# Patient Record
Sex: Female | Born: 1960
Health system: Southern US, Community
[De-identification: ages and names within clinical notes are randomized; demographics above are authoritative.]

## PROBLEM LIST (undated history)

## (undated) DIAGNOSIS — E039 Hypothyroidism, unspecified: Secondary | ICD-10-CM

## (undated) DIAGNOSIS — E785 Hyperlipidemia, unspecified: Secondary | ICD-10-CM

## (undated) HISTORY — DX: Hypothyroidism, unspecified: E03.9

## (undated) HISTORY — DX: Hyperlipidemia, unspecified: E78.5

---

## 2009-06-29 ENCOUNTER — Encounter: Admission: RE | Admit: 2009-06-29 | Discharge: 2009-06-29 | Payer: Self-pay | Admitting: Specialist

## 2012-04-06 ENCOUNTER — Other Ambulatory Visit: Payer: Self-pay | Admitting: Obstetrics and Gynecology

## 2012-04-06 ENCOUNTER — Other Ambulatory Visit (HOSPITAL_COMMUNITY)
Admission: RE | Admit: 2012-04-06 | Discharge: 2012-04-06 | Disposition: A | Discharge status: Home / Self Care | Payer: BC Managed Care – PPO | Source: Ambulatory Visit | Attending: Obstetrics and Gynecology | Admitting: Obstetrics and Gynecology

## 2012-04-06 DIAGNOSIS — Z01419 Encounter for gynecological examination (general) (routine) without abnormal findings: Secondary | ICD-10-CM | POA: Insufficient documentation

## 2012-04-06 DIAGNOSIS — Z1151 Encounter for screening for human papillomavirus (HPV): Secondary | ICD-10-CM | POA: Insufficient documentation

## 2014-04-27 ENCOUNTER — Other Ambulatory Visit: Payer: Self-pay

## 2017-02-27 ENCOUNTER — Other Ambulatory Visit: Payer: Self-pay | Admitting: Obstetrics and Gynecology

## 2017-02-27 ENCOUNTER — Other Ambulatory Visit (HOSPITAL_COMMUNITY)
Admission: RE | Admit: 2017-02-27 | Discharge: 2017-02-27 | Disposition: A | Discharge status: Home / Self Care | Payer: 59 | Source: Ambulatory Visit | Attending: Obstetrics and Gynecology | Admitting: Obstetrics and Gynecology

## 2017-02-27 DIAGNOSIS — Z124 Encounter for screening for malignant neoplasm of cervix: Secondary | ICD-10-CM | POA: Diagnosis present

## 2017-03-04 LAB — CYTOLOGY - PAP
DIAGNOSIS: NEGATIVE
HPV (WINDOPATH): NOT DETECTED

## 2019-08-03 ENCOUNTER — Other Ambulatory Visit: Payer: Self-pay

## 2019-08-04 ENCOUNTER — Encounter: Payer: Self-pay | Admitting: Internal Medicine

## 2019-08-04 ENCOUNTER — Ambulatory Visit (INDEPENDENT_AMBULATORY_CARE_PROVIDER_SITE_OTHER): Payer: 59 | Admitting: Internal Medicine

## 2019-08-04 VITALS — BP 98/64 | HR 68 | Temp 97.3°F | Ht 62.0 in | Wt 121.8 lb

## 2019-08-04 DIAGNOSIS — E039 Hypothyroidism, unspecified: Secondary | ICD-10-CM | POA: Diagnosis not present

## 2019-08-04 DIAGNOSIS — E785 Hyperlipidemia, unspecified: Secondary | ICD-10-CM

## 2019-08-04 DIAGNOSIS — Z1231 Encounter for screening mammogram for malignant neoplasm of breast: Secondary | ICD-10-CM

## 2019-08-04 NOTE — Progress Notes (Signed)
New Patient Office Visit     This visit occurred during the SARS-CoV-2 public health emergency.  Safety protocols were in place, including screening questions prior to the visit, additional usage of staff PPE, and extensive cleaning of exam room while observing appropriate contact time as indicated for disinfecting solutions.    CC/Reason for Visit: Establish care, discuss chronic conditions Previous PCP: Dr. Ricci Barker Last Visit: 2018  HPI: Sarah Marshall is a 59 y.o. female who is coming in today for the above mentioned reasons. Past Medical History is significant for: Hyperlipidemia not on medications and hypothyroidism no longer on medications.  She has not seen her PCP since 2015.  She is married, she has 2 children ages 78 and 78 she works as an Equities trader.  She has no past surgical history.  She has no known drug allergies.  She is not a smoker, she drinks alcohol occasionally.  Her family history significant for a father who died at age 6 with coronary artery disease and a mother who has an undisclosed arrhythmia and is status post permanent pacemaker.  She is very physically active she walks for about an hour and a half 3 times a week, she also enjoys kayaking and hiking on the weekends.  She has no acute complaints today.  She has had both of her Covid vaccinations.   Past Medical/Surgical History: Past Medical History:  Diagnosis Date  . Hyperlipidemia   . Hypothyroidism     History reviewed. No pertinent surgical history.  Social History:  reports that she has never smoked. She has never used smokeless tobacco. She reports current alcohol use. She reports that she does not use drugs.  Allergies: No Known Allergies  Family History:  Family History  Problem Relation Age of Onset  . Arrhythmia Mother   . CAD Father      Current Outpatient Medications:  .  Cholecalciferol (VITAMIN D) 50 MCG (2000 UT) tablet, Take 2,000 Units by mouth daily., Disp: , Rfl:  .   Lysine HCl 1000 MG TABS, Take by mouth., Disp: , Rfl:  .  Magnesium Ascorbate POWD, by Does not apply route. Magic Mag, Disp: , Rfl:   Review of Systems:  Constitutional: Denies fever, chills, diaphoresis, appetite change and fatigue.  HEENT: Denies photophobia, eye pain, redness, hearing loss, ear pain, congestion, sore throat, rhinorrhea, sneezing, mouth sores, trouble swallowing, neck pain, neck stiffness and tinnitus.   Respiratory: Denies SOB, DOE, cough, chest tightness,  and wheezing.   Cardiovascular: Denies chest pain, palpitations and leg swelling.  Gastrointestinal: Denies nausea, vomiting, abdominal pain, diarrhea, constipation, blood in stool and abdominal distention.  Genitourinary: Denies dysuria, urgency, frequency, hematuria, flank pain and difficulty urinating.  Endocrine: Denies: hot or cold intolerance, sweats, changes in hair or nails, polyuria, polydipsia. Musculoskeletal: Denies myalgias, back pain, joint swelling, arthralgias and gait problem.  Skin: Denies pallor, rash and wound.  Neurological: Denies dizziness, seizures, syncope, weakness, light-headedness, numbness and headaches.  Hematological: Denies adenopathy. Easy bruising, personal or family bleeding history  Psychiatric/Behavioral: Denies suicidal ideation, mood changes, confusion, nervousness, sleep disturbance and agitation    Physical Exam: Vitals:   08/04/19 1338  BP: 98/64  Pulse: 68  Temp: (!) 97.3 F (36.3 C)  TempSrc: Temporal  SpO2: 96%  Weight: 121 lb 12.8 oz (55.2 kg)  Height: 5\' 2"  (1.575 m)   Body mass index is 22.28 kg/m.  Constitutional: NAD, calm, comfortable Eyes: PERRL, lids and conjunctivae normal ENMT: Mucous membranes are moist.  Respiratory:  clear to auscultation bilaterally, no wheezing, no crackles. Normal respiratory effort. No accessory muscle use.  Cardiovascular: Regular rate and rhythm, no murmurs / rubs / gallops. No extremity edema.  Neurologic: Grossly intact and  nonfocal Psychiatric: Normal judgment and insight. Alert and oriented x 3. Normal mood.    Impression and Plan:  Encounter for screening mammogram for malignant neoplasm of breast  - Plan: MM Digital Screening  Hypothyroidism, unspecified type -No longer medications, check thyroid function test when she returns for physical.  Hyperlipidemia -No LDL on file, check lipids when she returns for CPE.     Lelon Frohlich, MD [facility]

## 2019-09-01 ENCOUNTER — Ambulatory Visit
Admission: RE | Admit: 2019-09-01 | Discharge: 2019-09-01 | Disposition: A | Discharge status: Home / Self Care | Payer: 59 | Source: Ambulatory Visit | Attending: Internal Medicine | Admitting: Internal Medicine

## 2019-09-01 ENCOUNTER — Other Ambulatory Visit: Payer: Self-pay

## 2019-09-01 DIAGNOSIS — Z1231 Encounter for screening mammogram for malignant neoplasm of breast: Secondary | ICD-10-CM

## 2019-11-09 ENCOUNTER — Other Ambulatory Visit: Payer: Self-pay

## 2019-11-10 ENCOUNTER — Ambulatory Visit (INDEPENDENT_AMBULATORY_CARE_PROVIDER_SITE_OTHER): Payer: 59 | Admitting: Internal Medicine

## 2019-11-10 ENCOUNTER — Encounter: Payer: Self-pay | Admitting: Internal Medicine

## 2019-11-10 VITALS — BP 98/64 | HR 61 | Temp 98.1°F | Ht 63.0 in | Wt 120.3 lb

## 2019-11-10 DIAGNOSIS — Z0000 Encounter for general adult medical examination without abnormal findings: Secondary | ICD-10-CM

## 2019-11-10 DIAGNOSIS — E039 Hypothyroidism, unspecified: Secondary | ICD-10-CM | POA: Diagnosis not present

## 2019-11-10 NOTE — Addendum Note (Signed)
Addended by: Lerry Liner on: 11/10/2019 08:36 AM   Modules accepted: Orders

## 2019-11-10 NOTE — Patient Instructions (Signed)
-Nice seeing you today!!  -Lab work today; will notify you once results are available.  -Schedule follow up in 1 year or sooner as needed.   Preventive Care 40-59 Years Old, Female Preventive care refers to visits with your health care provider and lifestyle choices that can promote health and wellness. This includes:  A yearly physical exam. This may also be called an annual well check.  Regular dental visits and eye exams.  Immunizations.  Screening for certain conditions.  Healthy lifestyle choices, such as eating a healthy diet, getting regular exercise, not using drugs or products that contain nicotine and tobacco, and limiting alcohol use. What can I expect for my preventive care visit? Physical exam Your health care provider will check your:  Height and weight. This may be used to calculate body mass index (BMI), which tells if you are at a healthy weight.  Heart rate and blood pressure.  Skin for abnormal spots. Counseling Your health care provider may ask you questions about your:  Alcohol, tobacco, and drug use.  Emotional well-being.  Home and relationship well-being.  Sexual activity.  Eating habits.  Work and work environment.  Method of birth control.  Menstrual cycle.  Pregnancy history. What immunizations do I need?  Influenza (flu) vaccine  This is recommended every year. Tetanus, diphtheria, and pertussis (Tdap) vaccine  You may need a Td booster every 10 years. Varicella (chickenpox) vaccine  You may need this if you have not been vaccinated. Zoster (shingles) vaccine  You may need this after age 60. Measles, mumps, and rubella (MMR) vaccine  You may need at least one dose of MMR if you were born in 1957 or later. You may also need a second dose. Pneumococcal conjugate (PCV13) vaccine  You may need this if you have certain conditions and were not previously vaccinated. Pneumococcal polysaccharide (PPSV23) vaccine  You may need  one or two doses if you smoke cigarettes or if you have certain conditions. Meningococcal conjugate (MenACWY) vaccine  You may need this if you have certain conditions. Hepatitis A vaccine  You may need this if you have certain conditions or if you travel or work in places where you may be exposed to hepatitis A. Hepatitis B vaccine  You may need this if you have certain conditions or if you travel or work in places where you may be exposed to hepatitis B. Haemophilus influenzae type b (Hib) vaccine  You may need this if you have certain conditions. Human papillomavirus (HPV) vaccine  If recommended by your health care provider, you may need three doses over 6 months. You may receive vaccines as individual doses or as more than one vaccine together in one shot (combination vaccines). Talk with your health care provider about the risks and benefits of combination vaccines. What tests do I need? Blood tests  Lipid and cholesterol levels. These may be checked every 5 years, or more frequently if you are over 50 years old.  Hepatitis C test.  Hepatitis B test. Screening  Lung cancer screening. You may have this screening every year starting at age 55 if you have a 30-pack-year history of smoking and currently smoke or have quit within the past 15 years.  Colorectal cancer screening. All adults should have this screening starting at age 50 and continuing until age 75. Your health care provider may recommend screening at age 45 if you are at increased risk. You will have tests every 1-10 years, depending on your results and the type   of screening test.  Diabetes screening. This is done by checking your blood sugar (glucose) after you have not eaten for a while (fasting). You may have this done every 1-3 years.  Mammogram. This may be done every 1-2 years. Talk with your health care provider about when you should start having regular mammograms. This may depend on whether you have a family  history of breast cancer.  BRCA-related cancer screening. This may be done if you have a family history of breast, ovarian, tubal, or peritoneal cancers.  Pelvic exam and Pap test. This may be done every 3 years starting at age 9. Starting at age 72, this may be done every 5 years if you have a Pap test in combination with an HPV test. Other tests  Sexually transmitted disease (STD) testing.  Bone density scan. This is done to screen for osteoporosis. You may have this scan if you are at high risk for osteoporosis. Follow these instructions at home: Eating and drinking  Eat a diet that includes fresh fruits and vegetables, whole grains, lean protein, and low-fat dairy.  Take vitamin and mineral supplements as recommended by your health care provider.  Do not drink alcohol if: ? Your health care provider tells you not to drink. ? You are pregnant, may be pregnant, or are planning to become pregnant.  If you drink alcohol: ? Limit how much you have to 0-1 drink a day. ? Be aware of how much alcohol is in your drink. In the [place] one drink equals one 12 oz bottle of beer (355 mL), one 5 oz glass of wine (148 mL), or one 1 oz glass of hard liquor (44 mL). Lifestyle  Take daily care of your teeth and gums.  Stay active. Exercise for at least 30 minutes on 5 or more days each week.  Do not use any products that contain nicotine or tobacco, such as cigarettes, e-cigarettes, and chewing tobacco. If you need help quitting, ask your health care provider.  If you are sexually active, practice safe sex. Use a condom or other form of birth control (contraception) in order to prevent pregnancy and STIs (sexually transmitted infections).  If told by your health care provider, take low-dose aspirin daily starting at age 83. What's next?  Visit your health care provider once a year for a well check visit.  Ask your health care provider how often you should have your eyes and teeth  checked.  Stay up to date on all vaccines. This information is not intended to replace advice given to you by your health care provider. Make sure you discuss any questions you have with your health care provider. Document Revised: 10/09/2017 Document Reviewed: 10/09/2017 Elsevier Patient Education  2020 [employer].

## 2019-11-10 NOTE — Progress Notes (Signed)
Established Patient Office Visit     This visit occurred during the SARS-CoV-2 public health emergency.  Safety protocols were in place, including screening questions prior to the visit, additional usage of staff PPE, and extensive cleaning of exam room while observing appropriate contact time as indicated for disinfecting solutions.    CC/Reason for Visit: Annual preventive exam  HPI: Sarah Marshall is a 59 y.o. female who is coming in today for the above mentioned reasons. Past Medical History is significant for: Hypothyroidism not on medications.  She has no acute complaints today.  She had a normal mammogram in July, she had a Pap smear in 2019 and follows with GYN.  She had a colonoscopy in 2014.  She is due for Tdap and shingles vaccines.  She tells me she has a history of hyperlipidemia but is not currently on medications.  She exercises 5 days a week combination of walking, hiking and sometimes kayaking.   Past Medical/Surgical History: Past Medical History:  Diagnosis Date  . Hyperlipidemia   . Hypothyroidism     No past surgical history on file.  Social History:  reports that she has never smoked. She has never used smokeless tobacco. She reports current alcohol use. She reports that she does not use drugs.  Allergies: No Known Allergies  Family History:  Family History  Problem Relation Age of Onset  . Arrhythmia Mother   . CAD Father      Current Outpatient Medications:  .  Cholecalciferol (VITAMIN D) 50 MCG (2000 UT) tablet, Take 2,000 Units by mouth daily., Disp: , Rfl:  .  Lysine HCl 1000 MG TABS, Take by mouth., Disp: , Rfl:  .  Magnesium Ascorbate POWD, by Does not apply route. Magic Mag, Disp: , Rfl:   Review of Systems:  Constitutional: Denies fever, chills, diaphoresis, appetite change and fatigue.  HEENT: Denies photophobia, eye pain, redness, hearing loss, ear pain, congestion, sore throat, rhinorrhea, sneezing, mouth sores, trouble  swallowing, neck pain, neck stiffness and tinnitus.   Respiratory: Denies SOB, DOE, cough, chest tightness,  and wheezing.   Cardiovascular: Denies chest pain, palpitations and leg swelling.  Gastrointestinal: Denies nausea, vomiting, abdominal pain, diarrhea, constipation, blood in stool and abdominal distention.  Genitourinary: Denies dysuria, urgency, frequency, hematuria, flank pain and difficulty urinating.  Endocrine: Denies: hot or cold intolerance, sweats, changes in hair or nails, polyuria, polydipsia. Musculoskeletal: Denies myalgias, back pain, joint swelling, arthralgias and gait problem.  Skin: Denies pallor, rash and wound.  Neurological: Denies dizziness, seizures, syncope, weakness, light-headedness, numbness and headaches.  Hematological: Denies adenopathy. Easy bruising, personal or family bleeding history  Psychiatric/Behavioral: Denies suicidal ideation, mood changes, confusion, nervousness, sleep disturbance and agitation    Physical Exam: Vitals:   11/10/19 0744  BP: 98/64  Pulse: 61  Temp: 98.1 F (36.7 C)  TempSrc: Oral  SpO2: 96%  Weight: 120 lb 4.8 oz (54.6 kg)  Height: _0  (1.6 m)    Body mass index is 21.31 kg/m.   Constitutional: NAD, calm, comfortable Eyes: PERRL, lids and conjunctivae normal ENMT: Mucous membranes are moist. Posterior pharynx clear of any exudate or lesions. Normal dentition. Tympanic membrane is pearly white, no erythema or bulging. Neck: normal, supple, no masses, no thyromegaly Respiratory: clear to auscultation bilaterally, no wheezing, no crackles. Normal respiratory effort. No accessory muscle use.  Cardiovascular: Regular rate and rhythm, no murmurs / rubs / gallops. No extremity edema. 2+ pedal pulses. No carotid bruits.  Abdomen: no tenderness,  no masses palpated. No hepatosplenomegaly. Bowel sounds positive.  Musculoskeletal: no clubbing / cyanosis. No joint deformity upper and lower extremities. Good ROM, no  contractures. Normal muscle tone.  Skin: no rashes, lesions, ulcers. No induration Neurologic: CN 2-12 grossly intact. Sensation intact, DTR normal. Strength 5/5 in all 4.  Psychiatric: Normal judgment and insight. Alert and oriented x 3. Normal mood.    Impression and Plan:  Encounter for preventive health examination  -Advised routine eye and dental care. -She is due for Tdap and shingles vaccines, she would like to obtain these at her pharmacy, otherwise immunizations are up-to-date. -Screening labs today. -Healthy lifestyle discussed in detail. -Normal colonoscopy in 2014, 10-year callback. -Normal mammogram in July 2021. -She follows GYN for Pap smears, last was in 2019.  Hypothyroidism, unspecified type  - Plan: TSH    Patient Instructions  -Nice seeing you today!!  -Lab work today; will notify you once results are available.  -Schedule follow up in 1 year or sooner as needed.   Preventive Care 42-78 Years Old, Female Preventive care refers to visits with your health care provider and lifestyle choices that can promote health and wellness. This includes:  A yearly physical exam. This may also be called an annual well check.  Regular dental visits and eye exams.  Immunizations.  Screening for certain conditions.  Healthy lifestyle choices, such as eating a healthy diet, getting regular exercise, not using drugs or products that contain nicotine and tobacco, and limiting alcohol use. What can I expect for my preventive care visit? Physical exam Your health care provider will check your:  Height and weight. This may be used to calculate body mass index (BMI), which tells if you are at a healthy weight.  Heart rate and blood pressure.  Skin for abnormal spots. Counseling Your health care provider may ask you questions about your:  Alcohol, tobacco, and drug use.  Emotional well-being.  Home and relationship well-being.  Sexual activity.  Eating  habits.  Work and work Statistician.  Method of birth control.  Menstrual cycle.  Pregnancy history. What immunizations do I need?  Influenza (flu) vaccine  This is recommended every year. Tetanus, diphtheria, and pertussis (Tdap) vaccine  You may need a Td booster every 10 years. Varicella (chickenpox) vaccine  You may need this if you have not been vaccinated. Zoster (shingles) vaccine  You may need this after age 52. Measles, mumps, and rubella (MMR) vaccine  You may need at least one dose of MMR if you were born in 1957 or later. You may also need a second dose. Pneumococcal conjugate (PCV13) vaccine  You may need this if you have certain conditions and were not previously vaccinated. Pneumococcal polysaccharide (PPSV23) vaccine  You may need one or two doses if you smoke cigarettes or if you have certain conditions. Meningococcal conjugate (MenACWY) vaccine  You may need this if you have certain conditions. Hepatitis A vaccine  You may need this if you have certain conditions or if you travel or work in places where you may be exposed to hepatitis A. Hepatitis B vaccine  You may need this if you have certain conditions or if you travel or work in places where you may be exposed to hepatitis B. Haemophilus influenzae type b (Hib) vaccine  You may need this if you have certain conditions. Human papillomavirus (HPV) vaccine  If recommended by your health care provider, you may need three doses over 6 months. You may receive vaccines as individual doses  or as more than one vaccine together in one shot (combination vaccines). Talk with your health care provider about the risks and benefits of combination vaccines. What tests do I need? Blood tests  Lipid and cholesterol levels. These may be checked every 5 years, or more frequently if you are over 71 years old.  Hepatitis C test.  Hepatitis B test. Screening  Lung cancer screening. You may have this screening  every year starting at age 69 if you have a 30-pack-year history of smoking and currently smoke or have quit within the past 15 years.  Colorectal cancer screening. All adults should have this screening starting at age 59 and continuing until age 54. Your health care provider may recommend screening at age 86 if you are at increased risk. You will have tests every 1-10 years, depending on your results and the type of screening test.  Diabetes screening. This is done by checking your blood sugar (glucose) after you have not eaten for a while (fasting). You may have this done every 1-3 years.  Mammogram. This may be done every 1-2 years. Talk with your health care provider about when you should start having regular mammograms. This may depend on whether you have a family history of breast cancer.  BRCA-related cancer screening. This may be done if you have a family history of breast, ovarian, tubal, or peritoneal cancers.  Pelvic exam and Pap test. This may be done every 3 years starting at age 72. Starting at age 87, this may be done every 5 years if you have a Pap test in combination with an HPV test. Other tests  Sexually transmitted disease (STD) testing.  Bone density scan. This is done to screen for osteoporosis. You may have this scan if you are at high risk for osteoporosis. Follow these instructions at home: Eating and drinking  Eat a diet that includes fresh fruits and vegetables, whole grains, lean protein, and low-fat dairy.  Take vitamin and mineral supplements as recommended by your health care provider.  Do not drink alcohol if: ? Your health care provider tells you not to drink. ? You are pregnant, may be pregnant, or are planning to become pregnant.  If you drink alcohol: ? Limit how much you have to 0-1 drink a day. ? Be aware of how much alcohol is in your drink. In the [place] one drink equals one 12 oz bottle of beer (355 mL), one 5 oz glass of wine (148 mL), or one 1  oz glass of hard liquor (44 mL). Lifestyle  Take daily care of your teeth and gums.  Stay active. Exercise for at least 30 minutes on 5 or more days each week.  Do not use any products that contain nicotine or tobacco, such as cigarettes, e-cigarettes, and chewing tobacco. If you need help quitting, ask your health care provider.  If you are sexually active, practice safe sex. Use a condom or other form of birth control (contraception) in order to prevent pregnancy and STIs (sexually transmitted infections).  If told by your health care provider, take low-dose aspirin daily starting at age 78. What's next?  Visit your health care provider once a year for a well check visit.  Ask your health care provider how often you should have your eyes and teeth checked.  Stay up to date on all vaccines. This information is not intended to replace advice given to you by your health care provider. Make sure you discuss any questions you have with  your health care provider. Document Revised: 10/09/2017 Document Reviewed: 10/09/2017 Elsevier Patient Education  2020 [place], MD [facility]

## 2019-11-11 ENCOUNTER — Encounter: Payer: Self-pay | Admitting: Internal Medicine

## 2019-11-11 ENCOUNTER — Other Ambulatory Visit: Payer: Self-pay | Admitting: Internal Medicine

## 2019-11-11 DIAGNOSIS — E785 Hyperlipidemia, unspecified: Secondary | ICD-10-CM | POA: Insufficient documentation

## 2019-11-11 LAB — COMPREHENSIVE METABOLIC PANEL
AG Ratio: 2.2 (calc) (ref 1.0–2.5)
ALT: 16 U/L (ref 6–29)
AST: 13 U/L (ref 10–35)
Albumin: 4.7 g/dL (ref 3.6–5.1)
Alkaline phosphatase (APISO): 91 U/L (ref 37–153)
BUN: 14 mg/dL (ref 7–25)
CO2: 30 mmol/L (ref 20–32)
Calcium: 10.1 mg/dL (ref 8.6–10.4)
Chloride: 106 mmol/L (ref 98–110)
Creat: 0.68 mg/dL (ref 0.50–1.05)
Globulin: 2.1 g/dL (calc) (ref 1.9–3.7)
Glucose, Bld: 100 mg/dL — ABNORMAL HIGH (ref 65–99)
Potassium: 5.1 mmol/L (ref 3.5–5.3)
Sodium: 141 mmol/L (ref 135–146)
Total Bilirubin: 0.4 mg/dL (ref 0.2–1.2)
Total Protein: 6.8 g/dL (ref 6.1–8.1)

## 2019-11-11 LAB — CBC WITH DIFFERENTIAL/PLATELET
Absolute Monocytes: 257 cells/uL (ref 200–950)
Basophils Absolute: 40 cells/uL (ref 0–200)
Basophils Relative: 1.3 %
Eosinophils Absolute: 81 cells/uL (ref 15–500)
Eosinophils Relative: 2.6 %
HCT: 43 % (ref 35.0–45.0)
Hemoglobin: 14.5 g/dL (ref 11.7–15.5)
Lymphs Abs: 1420 cells/uL (ref 850–3900)
MCH: 30.5 pg (ref 27.0–33.0)
MCHC: 33.7 g/dL (ref 32.0–36.0)
MCV: 90.5 fL (ref 80.0–100.0)
MPV: 10.6 fL (ref 7.5–12.5)
Monocytes Relative: 8.3 %
Neutro Abs: 1302 cells/uL — ABNORMAL LOW (ref 1500–7800)
Neutrophils Relative %: 42 %
Platelets: 209 10*3/uL (ref 140–400)
RBC: 4.75 10*6/uL (ref 3.80–5.10)
RDW: 12.3 % (ref 11.0–15.0)
Total Lymphocyte: 45.8 %
WBC: 3.1 10*3/uL — ABNORMAL LOW (ref 3.8–10.8)

## 2019-11-11 LAB — LIPID PANEL
Cholesterol: 297 mg/dL — ABNORMAL HIGH (ref <200)
HDL: 59 mg/dL (ref >=50)
LDL Cholesterol (Calc): 211 mg/dL (calc) — ABNORMAL HIGH
Non-HDL Cholesterol (Calc): 238 mg/dL (calc) — ABNORMAL HIGH (ref <130)
Total CHOL/HDL Ratio: 5 (calc) — ABNORMAL HIGH (ref <5.0)
Triglycerides: 126 mg/dL (ref <150)

## 2019-11-11 LAB — VITAMIN B12: Vitamin B-12: 350 pg/mL (ref 200–1100)

## 2019-11-11 LAB — HEMOGLOBIN A1C
Hgb A1c MFr Bld: 5.5 % of total Hgb (ref <5.7)
Mean Plasma Glucose: 111 (calc)
eAG (mmol/L): 6.2 (calc)

## 2019-11-11 LAB — VITAMIN D 25 HYDROXY (VIT D DEFICIENCY, FRACTURES): Vit D, 25-Hydroxy: 43 ng/mL (ref 30–100)

## 2019-11-11 LAB — TSH: TSH: 1.9 mIU/L (ref 0.40–4.50)

## 2019-11-11 MED ORDER — ATORVASTATIN CALCIUM 80 MG PO TABS: 80.0000 mg | ORAL_TABLET | Freq: Every day | ORAL | 1 refills | Status: DC

## 2019-11-12 ENCOUNTER — Other Ambulatory Visit: Payer: Self-pay | Admitting: Internal Medicine

## 2019-11-12 DIAGNOSIS — E785 Hyperlipidemia, unspecified: Secondary | ICD-10-CM

## 2020-02-16 ENCOUNTER — Other Ambulatory Visit: Payer: 59

## 2020-02-16 DIAGNOSIS — Z20822 Contact with and (suspected) exposure to covid-19: Secondary | ICD-10-CM

## 2020-02-19 LAB — NOVEL CORONAVIRUS, NAA: SARS-CoV-2, NAA: NOT DETECTED

## 2020-05-16 ENCOUNTER — Other Ambulatory Visit: Payer: Self-pay

## 2020-05-24 ENCOUNTER — Encounter: Payer: Self-pay | Admitting: Internal Medicine

## 2020-05-24 ENCOUNTER — Other Ambulatory Visit: Payer: Self-pay | Admitting: Internal Medicine

## 2020-05-24 DIAGNOSIS — E785 Hyperlipidemia, unspecified: Secondary | ICD-10-CM

## 2020-05-30 ENCOUNTER — Other Ambulatory Visit: Payer: Self-pay

## 2020-05-30 ENCOUNTER — Other Ambulatory Visit (INDEPENDENT_AMBULATORY_CARE_PROVIDER_SITE_OTHER): Payer: 59

## 2020-05-30 DIAGNOSIS — E785 Hyperlipidemia, unspecified: Secondary | ICD-10-CM | POA: Diagnosis not present

## 2020-05-30 LAB — LIPID PANEL
Cholesterol: 280 mg/dL — ABNORMAL HIGH (ref 0–200)
HDL: 53 mg/dL (ref >39.00)
LDL Cholesterol: 202 mg/dL — ABNORMAL HIGH (ref 0–99)
NonHDL: 226.65
Total CHOL/HDL Ratio: 5
Triglycerides: 121 mg/dL (ref 0.0–149.0)
VLDL: 24.2 mg/dL (ref 0.0–40.0)

## 2020-05-30 NOTE — Addendum Note (Signed)
Addended by: Bonnye Fava on: 05/30/2020 08:27 AM   Modules accepted: Orders

## 2020-10-19 ENCOUNTER — Other Ambulatory Visit: Payer: Self-pay | Admitting: Internal Medicine

## 2020-10-19 DIAGNOSIS — Z1231 Encounter for screening mammogram for malignant neoplasm of breast: Secondary | ICD-10-CM

## 2020-10-31 ENCOUNTER — Other Ambulatory Visit: Payer: Self-pay

## 2020-10-31 ENCOUNTER — Ambulatory Visit
Admission: RE | Admit: 2020-10-31 | Discharge: 2020-10-31 | Disposition: A | Discharge status: Home / Self Care | Payer: 59 | Source: Ambulatory Visit

## 2020-10-31 DIAGNOSIS — Z1231 Encounter for screening mammogram for malignant neoplasm of breast: Secondary | ICD-10-CM

## 2021-10-04 ENCOUNTER — Other Ambulatory Visit (HOSPITAL_COMMUNITY)
Admission: RE | Admit: 2021-10-04 | Discharge: 2021-10-04 | Disposition: A | Discharge status: Home / Self Care | Payer: 59 | Source: Ambulatory Visit | Attending: Internal Medicine | Admitting: Internal Medicine

## 2021-10-04 ENCOUNTER — Encounter: Payer: Self-pay | Admitting: Internal Medicine

## 2021-10-04 ENCOUNTER — Ambulatory Visit (INDEPENDENT_AMBULATORY_CARE_PROVIDER_SITE_OTHER): Payer: 59 | Admitting: Internal Medicine

## 2021-10-04 VITALS — BP 102/64 | HR 65 | Temp 98.1°F | Ht 62.0 in | Wt 117.6 lb

## 2021-10-04 DIAGNOSIS — E782 Mixed hyperlipidemia: Secondary | ICD-10-CM

## 2021-10-04 DIAGNOSIS — E039 Hypothyroidism, unspecified: Secondary | ICD-10-CM

## 2021-10-04 DIAGNOSIS — Z0000 Encounter for general adult medical examination without abnormal findings: Secondary | ICD-10-CM

## 2021-10-04 DIAGNOSIS — Z124 Encounter for screening for malignant neoplasm of cervix: Secondary | ICD-10-CM | POA: Diagnosis not present

## 2021-10-04 LAB — LIPID PANEL
Cholesterol: 296 mg/dL — ABNORMAL HIGH (ref 0–200)
HDL: 53.9 mg/dL (ref >39.00)
LDL Cholesterol: 203 mg/dL — ABNORMAL HIGH (ref 0–99)
NonHDL: 242.21
Total CHOL/HDL Ratio: 5
Triglycerides: 194 mg/dL — ABNORMAL HIGH (ref 0.0–149.0)
VLDL: 38.8 mg/dL (ref 0.0–40.0)

## 2021-10-04 LAB — COMPREHENSIVE METABOLIC PANEL
ALT: 16 U/L (ref 0–35)
AST: 13 U/L (ref 0–37)
Albumin: 4.6 g/dL (ref 3.5–5.2)
Alkaline Phosphatase: 85 U/L (ref 39–117)
BUN: 17 mg/dL (ref 6–23)
CO2: 28 mEq/L (ref 19–32)
Calcium: 9.7 mg/dL (ref 8.4–10.5)
Chloride: 103 mEq/L (ref 96–112)
Creatinine, Ser: 0.72 mg/dL (ref 0.40–1.20)
GFR: 90.56 mL/min (ref >60.00)
Glucose, Bld: 91 mg/dL (ref 70–99)
Potassium: 3.7 mEq/L (ref 3.5–5.1)
Sodium: 137 mEq/L (ref 135–145)
Total Bilirubin: 0.5 mg/dL (ref 0.2–1.2)
Total Protein: 7.1 g/dL (ref 6.0–8.3)

## 2021-10-04 LAB — VITAMIN B12: Vitamin B-12: 233 pg/mL (ref 211–911)

## 2021-10-04 LAB — CBC WITH DIFFERENTIAL/PLATELET
Basophils Absolute: 0 10*3/uL (ref 0.0–0.1)
Basophils Relative: 0.8 % (ref 0.0–3.0)
Eosinophils Absolute: 0.1 10*3/uL (ref 0.0–0.7)
Eosinophils Relative: 2.4 % (ref 0.0–5.0)
HCT: 43.2 % (ref 36.0–46.0)
Hemoglobin: 14.6 g/dL (ref 12.0–15.0)
Lymphocytes Relative: 46.1 % — ABNORMAL HIGH (ref 12.0–46.0)
Lymphs Abs: 1.6 10*3/uL (ref 0.7–4.0)
MCHC: 33.7 g/dL (ref 30.0–36.0)
MCV: 91.4 fl (ref 78.0–100.0)
Monocytes Absolute: 0.3 10*3/uL (ref 0.1–1.0)
Monocytes Relative: 8.7 % (ref 3.0–12.0)
Neutro Abs: 1.5 10*3/uL (ref 1.4–7.7)
Neutrophils Relative %: 42 % — ABNORMAL LOW (ref 43.0–77.0)
Platelets: 216 10*3/uL (ref 150.0–400.0)
RBC: 4.73 Mil/uL (ref 3.87–5.11)
RDW: 12.8 % (ref 11.5–15.5)
WBC: 3.5 10*3/uL — ABNORMAL LOW (ref 4.0–10.5)

## 2021-10-04 LAB — TSH: TSH: 2.36 u[IU]/mL (ref 0.35–5.50)

## 2021-10-04 LAB — VITAMIN D 25 HYDROXY (VIT D DEFICIENCY, FRACTURES): VITD: 44.26 ng/mL (ref 30.00–100.00)

## 2021-10-04 LAB — HEMOGLOBIN A1C: Hgb A1c MFr Bld: 5.7 % (ref 4.6–6.5)

## 2021-10-04 NOTE — Progress Notes (Signed)
Established Patient Office Visit     CC/Reason for Visit: Annual preventive exam  HPI: Sarah Marshall is a 61 y.o. female who is coming in today for the above mentioned reasons. Past Medical History is significant for: Mild hyperlipidemia and hypothyroidism not on medication.  She has no acute concerns or complaints.  She is overdue for eye and dental care.  She will be due for mammogram in September, she will be due for colonoscopy in 2024.  She is due for Pap smear.  She is due for bivalent COVID, flu, shingles, Tdap vaccines.   Past Medical/Surgical History: Past Medical History:  Diagnosis Date   Hyperlipidemia    Hypothyroidism     No past surgical history on file.  Social History:  reports that she has never smoked. She has never used smokeless tobacco. She reports current alcohol use. She reports that she does not use drugs.  Allergies: No Known Allergies  Family History:  Family History  Problem Relation Age of Onset   Arrhythmia Mother    CAD Father      Current Outpatient Medications:    Cholecalciferol (VITAMIN D) 50 MCG (2000 UT) tablet, Take 2,000 Units by mouth daily., Disp: , Rfl:   Review of Systems:  Constitutional: Denies fever, chills, diaphoresis, appetite change and fatigue.  HEENT: Denies photophobia, eye pain, redness, hearing loss, ear pain, congestion, sore throat, rhinorrhea, sneezing, mouth sores, trouble swallowing, neck pain, neck stiffness and tinnitus.   Respiratory: Denies SOB, DOE, cough, chest tightness,  and wheezing.   Cardiovascular: Denies chest pain, palpitations and leg swelling.  Gastrointestinal: Denies nausea, vomiting, abdominal pain, diarrhea, constipation, blood in stool and abdominal distention.  Genitourinary: Denies dysuria, urgency, frequency, hematuria, flank pain and difficulty urinating.  Endocrine: Denies: hot or cold intolerance, sweats, changes in hair or nails, polyuria, polydipsia. Musculoskeletal:  Denies myalgias, back pain, joint swelling, arthralgias and gait problem.  Skin: Denies pallor, rash and wound.  Neurological: Denies dizziness, seizures, syncope, weakness, light-headedness, numbness and headaches.  Hematological: Denies adenopathy. Easy bruising, personal or family bleeding history  Psychiatric/Behavioral: Denies suicidal ideation, mood changes, confusion, nervousness, sleep disturbance and agitation    Physical Exam: Vitals:   10/04/21 0705  BP: 102/64  Pulse: 65  Temp: 98.1 F (36.7 C)  TempSrc: Oral  SpO2: 98%  Weight: 117 lb 9.6 oz (53.3 kg)  Height: 5\' 2"  (1.575 m)    Body mass index is 21.51 kg/m.   Constitutional: NAD, calm, comfortable Eyes: PERRL, lids and conjunctivae normal ENMT: Mucous membranes are moist. Posterior pharynx clear of any exudate or lesions. Normal dentition. Tympanic membrane is pearly white, no erythema or bulging. Neck: normal, supple, no masses, no thyromegaly Respiratory: clear to auscultation bilaterally, no wheezing, no crackles. Normal respiratory effort. No accessory muscle use.  Cardiovascular: Regular rate and rhythm, no murmurs / rubs / gallops. No extremity edema. 2+ pedal pulses. No carotid bruits.  Abdomen: no tenderness, no masses palpated. No hepatosplenomegaly. Bowel sounds positive.  Musculoskeletal: no clubbing / cyanosis. No joint deformity upper and lower extremities. Good ROM, no contractures. Normal muscle tone.  Skin: no rashes, lesions, ulcers. No induration Neurologic: CN 2-12 grossly intact. Sensation intact, DTR normal. Strength 5/5 in all 4.  Psychiatric: Normal judgment and insight. Alert and oriented x 3. Normal mood.    Impression and Plan:  Encounter for preventive health examination - Plan: CBC with Differential/Platelet, Comprehensive metabolic panel, Hemoglobin A1c, Vitamin B12, VITAMIN D 25 Hydroxy (Vit-D Deficiency,  Fractures) -Recommend routine eye and dental care. -Immunizations: Due for  bivalent COVID, flu, shingles, Tdap, declines today despite counseling. -Healthy lifestyle discussed in detail. -Labs to be updated today. -Colon cancer screening: 07/2012, 10-year follow-up -Breast cancer screening: 10/2020 -Cervical cancer screening: Performed in office today -Lung cancer screening: Not smoker, not applicable -Prostate cancer screening: Not applicable -DEXA: Not applicable  Hypothyroidism, unspecified type  - Plan: TSH  Mixed hyperlipidemia  - Plan: Lipid panel    Patient Instructions  -Nice seeing you today!!  -Lab work today; will notify you once results are available.  -Remember to schedule eye and dental exams.  -Remember your COVID, flu, shingles and tdap vaccines.  -See you back in 1 year or sooner as needed.      Chaya Jan, MD [facility]

## 2021-10-04 NOTE — Patient Instructions (Signed)
-  Nice seeing you today!!  -Lab work today; will notify you once results are available.  -Remember to schedule eye and dental exams.  -Remember your COVID, flu, shingles and tdap vaccines.  -See you back in 1 year or sooner as needed.

## 2021-10-04 NOTE — Addendum Note (Signed)
Addended by: Kern Reap B on: 10/04/2021 07:40 AM   Modules accepted: Orders

## 2021-10-08 LAB — CYTOLOGY - PAP
Comment: NEGATIVE
Diagnosis: NEGATIVE
High risk HPV: NEGATIVE

## 2021-10-10 ENCOUNTER — Telehealth: Payer: Self-pay | Admitting: Internal Medicine

## 2021-10-10 NOTE — Telephone Encounter (Signed)
Pt called, returning CMA's call. CMA was unavailable. Pt asked that CMA call back at their earliest convenience. 

## 2021-10-11 NOTE — Telephone Encounter (Signed)
Left message on machine for patient to return our call.  See lab result note. 

## 2021-10-17 ENCOUNTER — Other Ambulatory Visit: Payer: Self-pay | Admitting: *Deleted

## 2021-10-17 DIAGNOSIS — E782 Mixed hyperlipidemia: Secondary | ICD-10-CM

## 2022-03-21 ENCOUNTER — Encounter: Payer: Self-pay | Admitting: Internal Medicine

## 2022-03-21 DIAGNOSIS — Z13820 Encounter for screening for osteoporosis: Secondary | ICD-10-CM

## 2022-03-21 DIAGNOSIS — Z1231 Encounter for screening mammogram for malignant neoplasm of breast: Secondary | ICD-10-CM

## 2022-03-21 DIAGNOSIS — Z1211 Encounter for screening for malignant neoplasm of colon: Secondary | ICD-10-CM

## 2022-04-04 ENCOUNTER — Ambulatory Visit (HOSPITAL_BASED_OUTPATIENT_CLINIC_OR_DEPARTMENT_OTHER)
Admission: RE | Admit: 2022-04-04 | Discharge: 2022-04-04 | Disposition: A | Discharge status: Home / Self Care | Payer: Commercial Managed Care - PPO | Source: Ambulatory Visit | Attending: Internal Medicine | Admitting: Internal Medicine

## 2022-04-04 DIAGNOSIS — Z1231 Encounter for screening mammogram for malignant neoplasm of breast: Secondary | ICD-10-CM | POA: Insufficient documentation

## 2022-04-04 DIAGNOSIS — Z13820 Encounter for screening for osteoporosis: Secondary | ICD-10-CM

## 2022-04-09 ENCOUNTER — Other Ambulatory Visit: Payer: Self-pay | Admitting: Internal Medicine

## 2022-04-09 DIAGNOSIS — R928 Other abnormal and inconclusive findings on diagnostic imaging of breast: Secondary | ICD-10-CM

## 2022-04-10 ENCOUNTER — Encounter: Payer: Self-pay | Admitting: Internal Medicine

## 2022-04-10 DIAGNOSIS — Z1231 Encounter for screening mammogram for malignant neoplasm of breast: Secondary | ICD-10-CM

## 2022-04-11 ENCOUNTER — Encounter: Payer: Self-pay | Admitting: Internal Medicine

## 2022-04-11 DIAGNOSIS — M810 Age-related osteoporosis without current pathological fracture: Secondary | ICD-10-CM | POA: Insufficient documentation

## 2022-04-24 ENCOUNTER — Encounter: Payer: Self-pay | Admitting: Internal Medicine

## 2022-07-03 IMAGING — MG MM DIGITAL SCREENING BILAT W/ TOMO AND CAD
8 series · 8 of 24 positions shown · non-contrast
Comparison: Previous exam(s).

CLINICAL DATA: Screening.

EXAM:
DIGITAL SCREENING BILATERAL MAMMOGRAM WITH TOMOSYNTHESIS AND CAD
TECHNIQUE: Bilateral screening digital craniocaudal and mediolateral oblique
mammograms were obtained. Bilateral screening digital breast
tomosynthesis was performed. The images were evaluated with
computer-aided detection.

[R MLO synth-2D]
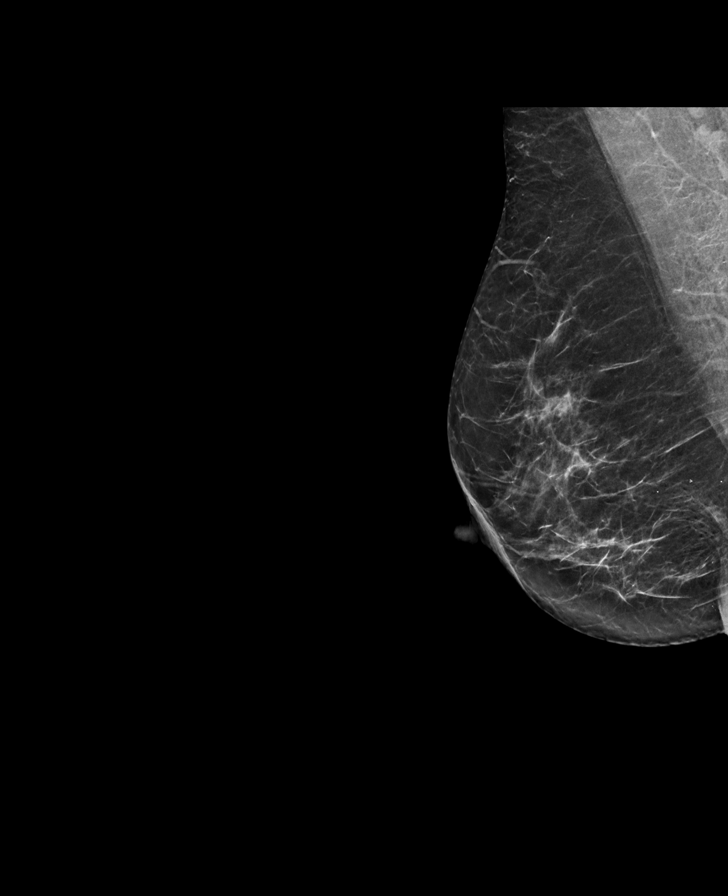

[L MLO synth-2D]
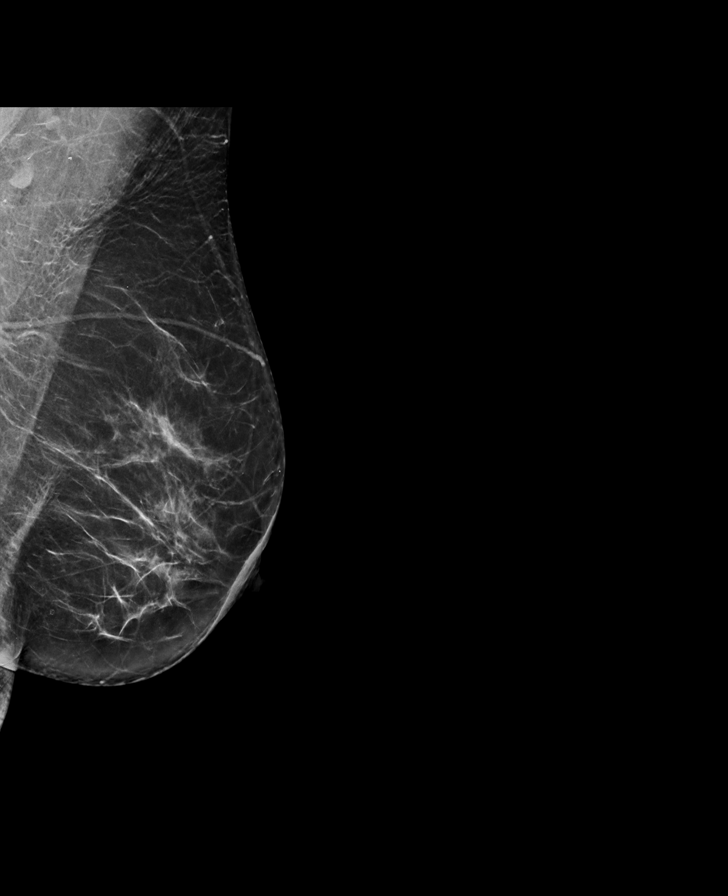

[R CC synth-2D]
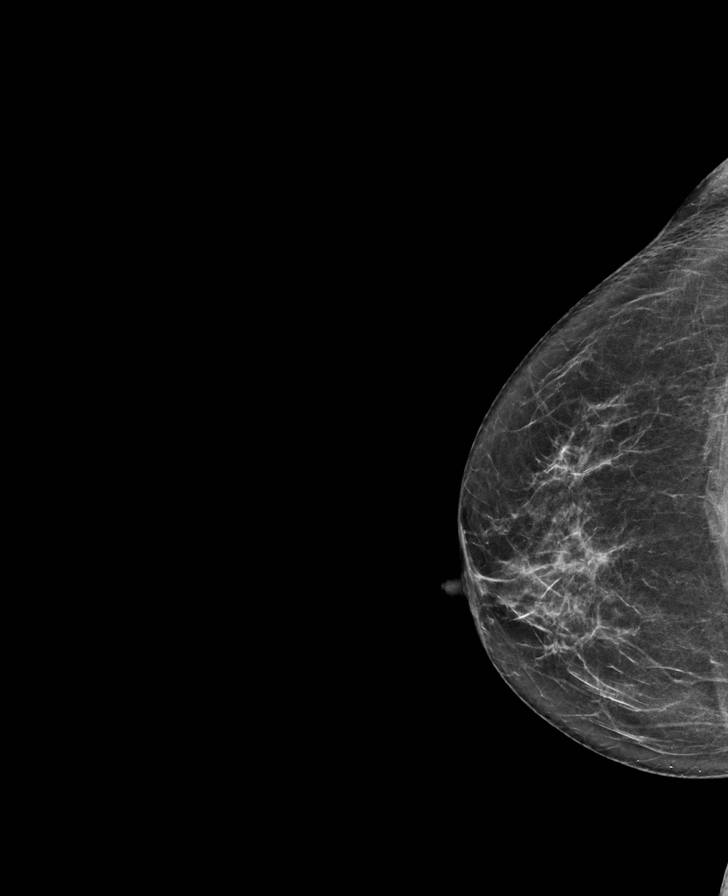

[L CC synth-2D]
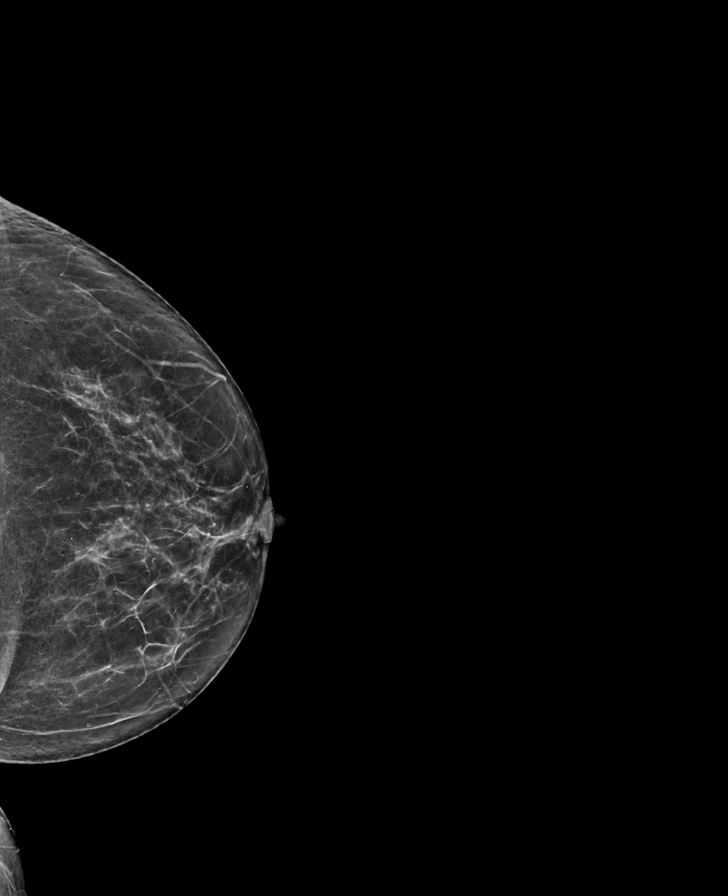

[L CC tomo · tomo slice 31/61.0]
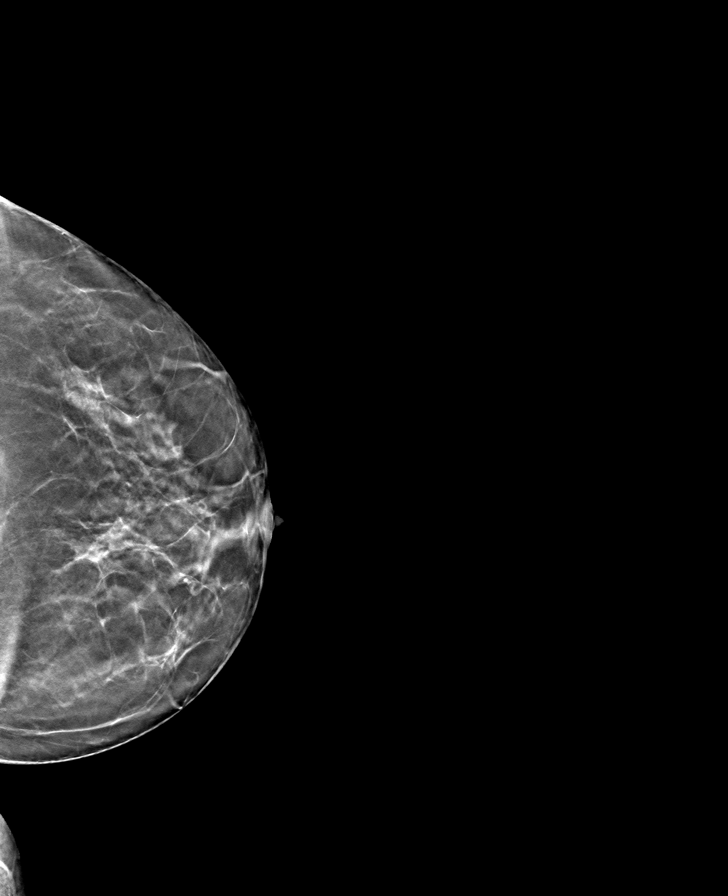

[R CC tomo · tomo slice 31/60.0]
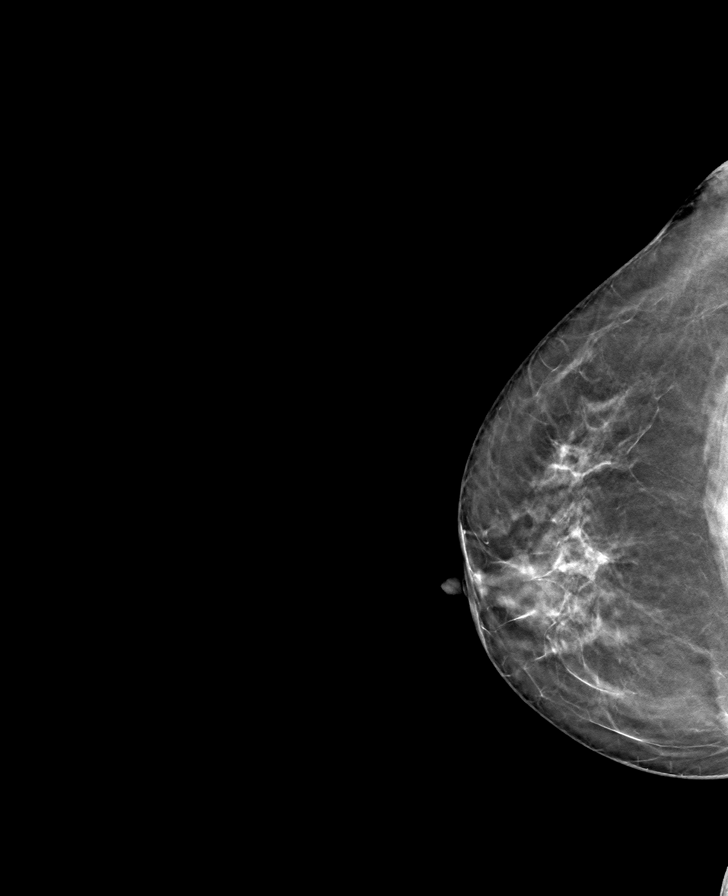

[L MLO tomo · tomo slice 37/72.0]
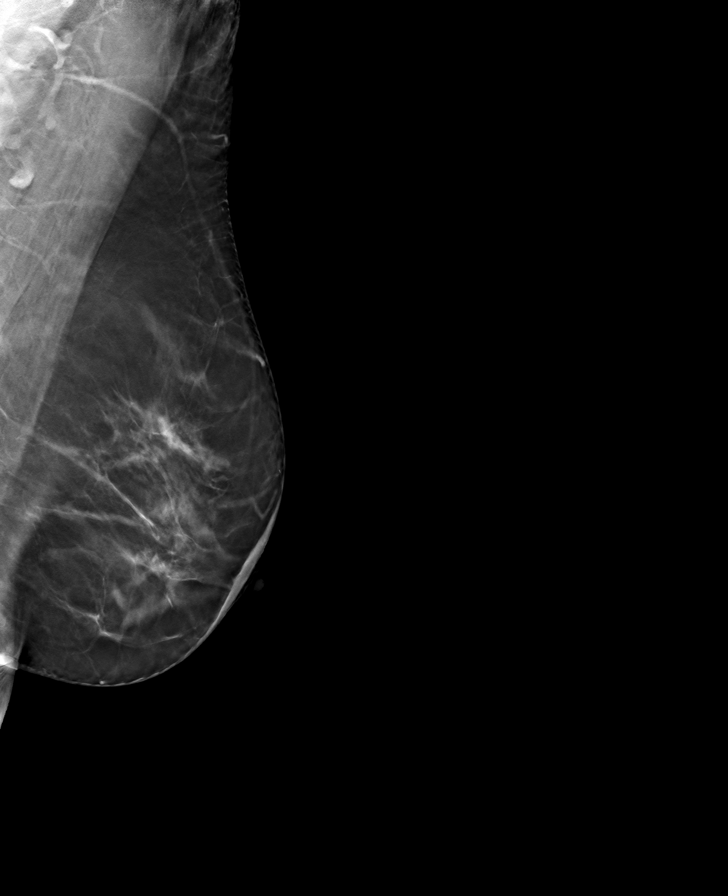

[R MLO tomo · tomo slice 32/63.0]
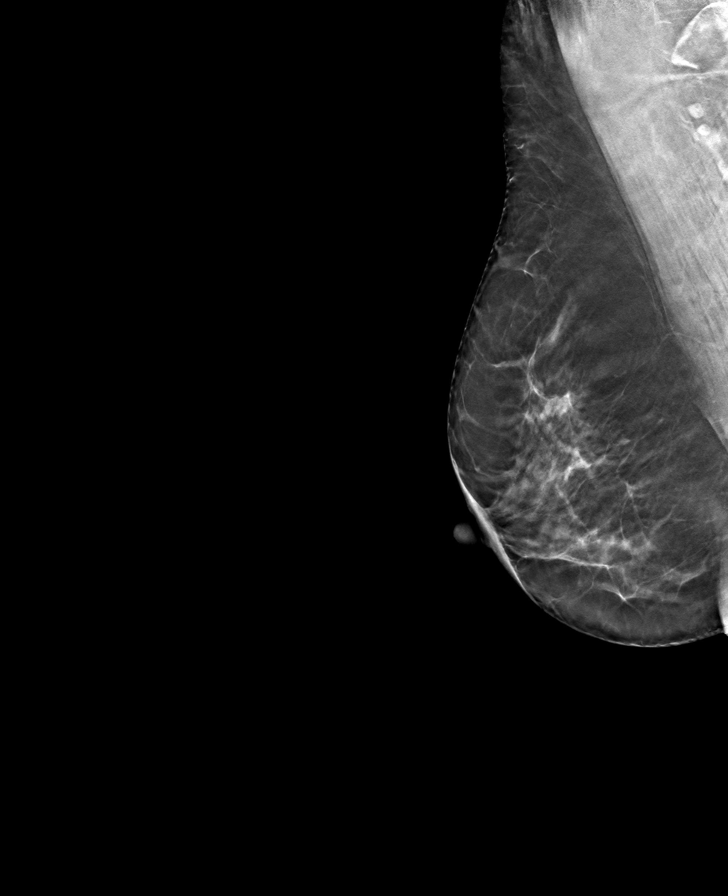

[8 of 24 positions shown; findings below may reference images not displayed]

ACR Breast Density Category b: There are scattered areas of
fibroglandular density.
FINDINGS: There are no findings suspicious for malignancy.
IMPRESSION: No mammographic evidence of malignancy. A result letter of this
screening mammogram will be mailed directly to the patient.

RECOMMENDATION:
Screening mammogram in one year. (Code:51-O-LD2)

BI-RADS CATEGORY  1: Negative.

## 2022-09-11 ENCOUNTER — Encounter (INDEPENDENT_AMBULATORY_CARE_PROVIDER_SITE_OTHER): Payer: Self-pay

## 2022-10-07 ENCOUNTER — Ambulatory Visit (INDEPENDENT_AMBULATORY_CARE_PROVIDER_SITE_OTHER): Payer: Commercial Managed Care - PPO | Admitting: Internal Medicine

## 2022-10-07 ENCOUNTER — Encounter: Payer: Self-pay | Admitting: Internal Medicine

## 2022-10-07 VITALS — BP 100/64 | HR 64 | Temp 98.3°F | Ht 61.5 in | Wt 115.9 lb

## 2022-10-07 DIAGNOSIS — Z0000 Encounter for general adult medical examination without abnormal findings: Secondary | ICD-10-CM | POA: Diagnosis not present

## 2022-10-07 DIAGNOSIS — Z114 Encounter for screening for human immunodeficiency virus [HIV]: Secondary | ICD-10-CM

## 2022-10-07 DIAGNOSIS — E039 Hypothyroidism, unspecified: Secondary | ICD-10-CM | POA: Diagnosis not present

## 2022-10-07 DIAGNOSIS — E782 Mixed hyperlipidemia: Secondary | ICD-10-CM

## 2022-10-07 DIAGNOSIS — Z1159 Encounter for screening for other viral diseases: Secondary | ICD-10-CM

## 2022-10-07 NOTE — Progress Notes (Signed)
Established Patient Office Visit     CC/Reason for Visit: Annual preventive exam  HPI: Sarah Marshall is a 62 y.o. female who is coming in today for the above mentioned reasons. Past Medical History is significant for: Mild hyperlipidemia and hypothyroidism not on medication.  Feeling well without concerns or complaints.  Has routine dental care, overdue for an eye exam.  She is due for many vaccines including flu, COVID, RSV, Tdap, shingles. -She had a mammogram earlier this year, Pap smear in August 2023, her last colonoscopy was in June 2014.   Past Medical/Surgical History: Past Medical History:  Diagnosis Date   Hyperlipidemia    Hypothyroidism     No past surgical history on file.  Social History:  reports that she has never smoked. She has never used smokeless tobacco. She reports current alcohol use. She reports that she does not use drugs.  Allergies: No Known Allergies  Family History:  Family History  Problem Relation Age of Onset   Arrhythmia Mother    CAD Father      Current Outpatient Medications:    Cholecalciferol (VITAMIN D) 50 MCG (2000 UT) tablet, Take 2,000 Units by mouth daily., Disp: , Rfl:   Review of Systems:  Negative unless indicated in HPI.   Physical Exam: Vitals:   10/07/22 0803  BP: 100/64  Pulse: 64  Temp: 98.3 F (36.8 C)  TempSrc: Oral  SpO2: 94%  Weight: 115 lb 14.4 oz (52.6 kg)  Height: 5' 1.5" (1.562 m)    Body mass index is 21.54 kg/m.   Physical Exam Vitals reviewed.  Constitutional:      General: She is not in acute distress.    Appearance: Normal appearance. She is not ill-appearing, toxic-appearing or diaphoretic.  HENT:     Head: Normocephalic.     Right Ear: Tympanic membrane, ear canal and external ear normal. There is no impacted cerumen.     Left Ear: Tympanic membrane, ear canal and external ear normal. There is no impacted cerumen.     Nose: Nose normal.     Mouth/Throat:     Mouth:  Mucous membranes are moist.     Pharynx: Oropharynx is clear. No oropharyngeal exudate or posterior oropharyngeal erythema.  Eyes:     General: No scleral icterus.       Right eye: No discharge.        Left eye: No discharge.     Conjunctiva/sclera: Conjunctivae normal.     Pupils: Pupils are equal, round, and reactive to light.  Neck:     Vascular: No carotid bruit.  Cardiovascular:     Rate and Rhythm: Normal rate and regular rhythm.     Pulses: Normal pulses.     Heart sounds: Normal heart sounds.  Pulmonary:     Effort: Pulmonary effort is normal. No respiratory distress.     Breath sounds: Normal breath sounds.  Abdominal:     General: Abdomen is flat. Bowel sounds are normal.     Palpations: Abdomen is soft.  Musculoskeletal:        General: Normal range of motion.     Cervical back: Normal range of motion.  Skin:    General: Skin is warm and dry.  Neurological:     General: No focal deficit present.     Mental Status: She is alert and oriented to person, place, and time. Mental status is at baseline.  Psychiatric:        Mood  and Affect: Mood normal.        Behavior: Behavior normal.        Thought Content: Thought content normal.        Judgment: Judgment normal.     Flowsheet Row Office Visit from 10/04/2021 in [facility]  PHQ-9 Total Score 0        Impression and Plan:  Encounter for preventive health examination -     CBC with Differential/Platelet; Future -     Comprehensive metabolic panel; Future  Mixed hyperlipidemia -     Lipid panel; Future  Hypothyroidism, unspecified type -     TSH; Future -     Vitamin B12; Future -     VITAMIN D 25 Hydroxy (Vit-D Deficiency, Fractures); Future  Encounter for screening for HIV -     HIV Antibody (routine testing w rflx); Future  Encounter for hepatitis C screening test for low risk patient -     Hepatitis C antibody; Future    -Recommend routine eye and dental  care. -Healthy lifestyle discussed in detail. -Labs to be updated today. -Prostate cancer screening: N/A Health Maintenance  Topic Date Due   HIV Screening  Never done   Hepatitis C Screening  Never done   DTaP/Tdap/Td vaccine (2 - Tdap) 02/12/2019   COVID-19 Vaccine (5 - 2023-24 season) 10/23/2022*   Colon Cancer Screening  01/07/2023*   Zoster (Shingles) Vaccine (1 of 2) 01/07/2023*   Flu Shot  05/12/2023*   Mammogram  04/04/2024   Pap Smear  10/04/2024   HPV Vaccine  Aged Out  *Topic was postponed. The date shown is not the original due date.    -Advised to update flu, COVID, RSV, Tdap, shingles vaccines.  She declines them all today despite counseling. -She is now due for her 10-year screening colonoscopy.  She declines referral today stating that she will have it done in her home country of [place].    Chaya Jan, MD [facility]

## 2022-10-11 ENCOUNTER — Other Ambulatory Visit (INDEPENDENT_AMBULATORY_CARE_PROVIDER_SITE_OTHER): Payer: Commercial Managed Care - PPO

## 2022-10-11 DIAGNOSIS — E782 Mixed hyperlipidemia: Secondary | ICD-10-CM | POA: Diagnosis not present

## 2022-10-11 DIAGNOSIS — Z1159 Encounter for screening for other viral diseases: Secondary | ICD-10-CM

## 2022-10-11 DIAGNOSIS — Z0000 Encounter for general adult medical examination without abnormal findings: Secondary | ICD-10-CM

## 2022-10-11 DIAGNOSIS — Z114 Encounter for screening for human immunodeficiency virus [HIV]: Secondary | ICD-10-CM

## 2022-10-11 DIAGNOSIS — E039 Hypothyroidism, unspecified: Secondary | ICD-10-CM | POA: Diagnosis not present

## 2022-10-11 LAB — CBC WITH DIFFERENTIAL/PLATELET
Basophils Absolute: 0 10*3/uL (ref 0.0–0.1)
Basophils Relative: 0.9 % (ref 0.0–3.0)
Eosinophils Absolute: 0.1 10*3/uL (ref 0.0–0.7)
Eosinophils Relative: 2.7 % (ref 0.0–5.0)
HCT: 42.8 % (ref 36.0–46.0)
Hemoglobin: 14.1 g/dL (ref 12.0–15.0)
Lymphocytes Relative: 40.6 % (ref 12.0–46.0)
Lymphs Abs: 1.5 10*3/uL (ref 0.7–4.0)
MCHC: 32.9 g/dL (ref 30.0–36.0)
MCV: 91.6 fl (ref 78.0–100.0)
Monocytes Absolute: 0.3 10*3/uL (ref 0.1–1.0)
Monocytes Relative: 8.6 % (ref 3.0–12.0)
Neutro Abs: 1.7 10*3/uL (ref 1.4–7.7)
Neutrophils Relative %: 47.2 % (ref 43.0–77.0)
Platelets: 232 10*3/uL (ref 150.0–400.0)
RBC: 4.67 Mil/uL (ref 3.87–5.11)
RDW: 12.7 % (ref 11.5–15.5)
WBC: 3.6 10*3/uL — ABNORMAL LOW (ref 4.0–10.5)

## 2022-10-11 LAB — COMPREHENSIVE METABOLIC PANEL
ALT: 19 U/L (ref 0–35)
AST: 15 U/L (ref 0–37)
Albumin: 4.4 g/dL (ref 3.5–5.2)
Alkaline Phosphatase: 82 U/L (ref 39–117)
BUN: 12 mg/dL (ref 6–23)
CO2: 27 mEq/L (ref 19–32)
Calcium: 9.6 mg/dL (ref 8.4–10.5)
Chloride: 103 mEq/L (ref 96–112)
Creatinine, Ser: 0.63 mg/dL (ref 0.40–1.20)
GFR: 95.4 mL/min (ref >60.00)
Glucose, Bld: 88 mg/dL (ref 70–99)
Potassium: 3.9 mEq/L (ref 3.5–5.1)
Sodium: 137 mEq/L (ref 135–145)
Total Bilirubin: 0.6 mg/dL (ref 0.2–1.2)
Total Protein: 6.9 g/dL (ref 6.0–8.3)

## 2022-10-11 LAB — LIPID PANEL
Cholesterol: 284 mg/dL — ABNORMAL HIGH (ref 0–200)
HDL: 52.2 mg/dL (ref >39.00)
LDL Cholesterol: 210 mg/dL — ABNORMAL HIGH (ref 0–99)
NonHDL: 231.41
Total CHOL/HDL Ratio: 5
Triglycerides: 106 mg/dL (ref 0.0–149.0)
VLDL: 21.2 mg/dL (ref 0.0–40.0)

## 2022-10-11 LAB — VITAMIN B12: Vitamin B-12: 589 pg/mL (ref 211–911)

## 2022-10-11 LAB — TSH: TSH: 2.23 u[IU]/mL (ref 0.35–5.50)

## 2022-10-11 LAB — VITAMIN D 25 HYDROXY (VIT D DEFICIENCY, FRACTURES): VITD: 54.04 ng/mL (ref 30.00–100.00)

## 2022-10-12 LAB — HEPATITIS C ANTIBODY: Hepatitis C Ab: NONREACTIVE

## 2022-10-12 LAB — HIV ANTIBODY (ROUTINE TESTING W REFLEX): HIV 1&2 Ab, 4th Generation: NONREACTIVE

## 2023-06-04 ENCOUNTER — Other Ambulatory Visit: Payer: Self-pay | Admitting: Medical Genetics

## 2023-06-10 ENCOUNTER — Other Ambulatory Visit

## 2023-06-10 DIAGNOSIS — Z006 Encounter for examination for normal comparison and control in clinical research program: Secondary | ICD-10-CM

## 2023-06-17 LAB — GENECONNECT MOLECULAR SCREEN: Genetic Analysis Overall Interpretation: NEGATIVE
